# Patient Record
Sex: Male | Born: 1955 | Race: White | Hispanic: No | Smoking: Former smoker
Health system: Southern US, Community
[De-identification: ages and names within clinical notes are randomized; demographics above are authoritative.]

## PROBLEM LIST (undated history)

## (undated) DIAGNOSIS — E119 Type 2 diabetes mellitus without complications: Secondary | ICD-10-CM

## (undated) HISTORY — PX: CHOLECYSTECTOMY: SHX55

---

## 2013-05-10 ENCOUNTER — Ambulatory Visit: Payer: Self-pay | Admitting: Family Medicine

## 2014-06-16 ENCOUNTER — Ambulatory Visit: Payer: Self-pay | Admitting: Internal Medicine

## 2014-07-09 ENCOUNTER — Ambulatory Visit: Payer: Self-pay | Admitting: Hematology and Oncology

## 2014-07-09 LAB — CBC CANCER CENTER
Basophil #: 0 "x10 3/mm "
Basophil %: 0.9 %
Eosinophil #: 0.1 "x10 3/mm "
Eosinophil %: 2.4 %
HCT: 39.9 % — ABNORMAL LOW
HGB: 13.3 g/dL
Lymphocyte %: 37.8 %
Lymphs Abs: 1.4 "x10 3/mm "
MCH: 30.7 pg
MCHC: 33.2 g/dL
MCV: 92 fL
Monocyte #: 0.2 "x10 3/mm "
Monocyte %: 6.8 %
Neutrophil #: 1.9 "x10 3/mm "
Neutrophil %: 52.1 %
Platelet: 176 "x10 3/mm "
RBC: 4.32 "x10 6/mm " — ABNORMAL LOW
RDW: 13.3 %
WBC: 3.6 "x10 3/mm " — ABNORMAL LOW

## 2014-07-09 LAB — RETICULOCYTES
Absolute Retic Count: 0.0223 x10 6/uL
Reticulocyte: 0.52 %

## 2014-07-09 LAB — IRON AND TIBC
Iron Bind.Cap.(Total): 299 ug/dL (ref 250–450)
Iron Saturation: 32 %
Iron: 97 ug/dL (ref 65–175)
Unbound Iron-Bind.Cap.: 202 ug/dL

## 2014-07-09 LAB — FERRITIN: Ferritin (ARMC): 79 ng/mL (ref 8–388)

## 2014-07-09 LAB — TSH: Thyroid Stimulating Horm: 2.7 u[IU]/mL

## 2014-07-09 LAB — FOLATE: Folic Acid: 19.5 ng/mL — ABNORMAL HIGH

## 2014-08-05 ENCOUNTER — Ambulatory Visit
Admit: 2014-08-05 | Disposition: A | Discharge status: Home / Self Care | Payer: Self-pay | Attending: Hematology and Oncology | Admitting: Hematology and Oncology

## 2014-08-05 ENCOUNTER — Ambulatory Visit: Payer: Self-pay | Admitting: Gastroenterology

## 2014-09-25 ENCOUNTER — Ambulatory Visit: Admit: 2014-09-25 | Disposition: A | Discharge status: Home / Self Care | Payer: Self-pay | Admitting: Surgery

## 2014-09-30 ENCOUNTER — Ambulatory Visit
Admit: 2014-09-30 | Disposition: A | Discharge status: Home / Self Care | Payer: Self-pay | Attending: Surgery | Admitting: Surgery

## 2014-10-01 LAB — SURGICAL PATHOLOGY

## 2014-10-05 NOTE — Op Note (Signed)
PATIENT NAME:  Russell Lutz, Russell Lutz MR#:  [id number] DATE OF BIRTH:  22-Nov-1955  DATE OF PROCEDURE:  09/30/2014  PREOPERATIVE DIAGNOSIS: Chronic acalculous cholecystitis.   POSTOPERATIVE DIAGNOSIS:  Chronic acalculous cholecystitis.    PROCEDURE: Laparoscopic cholecystectomy, cholangiogram.   SURGEON: Renda RollsWilton Smith, MD   ANESTHESIA: General.   INDICATIONS: This 59 year old male has a history of epigastric pains and abnormal  gallbladder ejection fraction of 25% and surgery was recommended for definitive treatment.   DESCRIPTION OF PROCEDURE: The patient was placed on the operating table in the supine position under general anesthesia. The abdomen was prepared with clippers and ChloraPrep and draped in a sterile manner.   A short incision was made in the inferior aspect of the umbilicus and carried down to the deep fascia which was grasped with laryngeal hook and elevated. A Veress needle was inserted, aspirated, and irrigated with a saline solution. Next, the peritoneal cavity was inflated with carbon dioxide. The Veress needle was removed. The 10 mm cannula was inserted. The 10 mm, 0 degree laparoscope was inserted to view the peritoneal cavity. There was some carbon dioxide seen within the omentum.  The liver appeared normal. There was some minimal distention of the stomach and I had the anesthetist insert an orogastric tube to decompress the stomach. Another incision was made in the epigastrium, slightly to the right of the midline to introduce an 11 mm cannula. Two incisions were made in the lateral aspect of the right upper quadrant to introduce two 5 mm cannulas.   The gallbladder was retracted towards the right shoulder. There were a number of adhesions which were taken down with blunt and sharp dissection and hook and cautery. The infundibulum was retracted inferiorly and laterally. The porta hepatis was demonstrated. The cystic duct was dissected free from surrounding structures. The cystic  artery was dissected free from surrounding structures. The gallbladder neck was mobilized with incision of the visceral peritoneum. A critical view of safety was demonstrated. An Endoclip was placed across the cystic duct adjacent to the gallbladder neck. An incision was made in the cystic duct to introduce a Reddick catheter. Half strength Conray 60 dye was injected as the cholangiogram was done with fluoroscopy viewing  the biliary tree. There was some diffuse dilatation of the common bile duct and the common hepatic duct more normal appearing caliber of the biliary branches. There was prompt flow of dye into the duodenum and no retained calculi seen. The Reddick catheter was removed. The cystic duct was doubly ligated with Endoclips and divided. The cystic artery was controlled with double Endoclips and divided. The gallbladder was dissected free from the liver with hook and cautery. The gallbladder was delivered up through the infraumbilical incision,  was incised and suctioned to remove bile. The gallbladder was then removed and submitted in formalin for routine pathology. The right upper quadrant was further inspected. Hemostasis was intact. The cannulas were removed allowing carbon dioxide to escape from the peritoneal cavity. The wounds were inspected.  Several tiny bleeding points were cauterized. The skin was closed with interrupted 5-0 chromic subcuticular suture, benzoin, and Steri-Strips. Dressings were applied with paper tape. The patient tolerated surgery satisfactorily and was prepared for transfer to the recovery room.      ____________________________ Shela CommonsJ. Renda RollsWilton Smith, MD jws:tr D: 09/30/2014 12:41:40 ET T: 09/30/2014 13:58:55 ET JOB#: [id number]  cc: Adella HareJ. Wilton Smith, MD, <Dictator> Adella HareWILTON J SMITH MD ELECTRONICALLY SIGNED 10/01/2014 17:34

## 2014-10-07 ENCOUNTER — Other Ambulatory Visit: Payer: Self-pay | Admitting: Surgery

## 2014-10-07 DIAGNOSIS — R1011 Right upper quadrant pain: Secondary | ICD-10-CM

## 2014-10-08 ENCOUNTER — Ambulatory Visit
Admission: RE | Admit: 2014-10-08 | Discharge: 2014-10-08 | Disposition: A | Discharge status: Home / Self Care | Payer: BLUE CROSS/BLUE SHIELD | Source: Ambulatory Visit | Attending: Surgery | Admitting: Surgery

## 2014-10-08 ENCOUNTER — Ambulatory Visit: Payer: Self-pay

## 2014-10-08 ENCOUNTER — Other Ambulatory Visit: Payer: Self-pay | Admitting: Surgery

## 2014-10-08 DIAGNOSIS — R1011 Right upper quadrant pain: Secondary | ICD-10-CM

## 2014-10-08 DIAGNOSIS — Z9049 Acquired absence of other specified parts of digestive tract: Secondary | ICD-10-CM | POA: Diagnosis not present

## 2014-10-08 HISTORY — DX: Type 2 diabetes mellitus without complications: E11.9

## 2014-10-08 MED ORDER — IOHEXOL 300 MG/ML  SOLN
100.0000 mL | Freq: Once | INTRAMUSCULAR | Status: AC | PRN
  Administered 2014-10-08: 100 mL via INTRAVENOUS

## 2016-12-24 IMAGING — CT CT ABDOMEN W/ CM
3 of 5 series · 16 of 46 positions shown, 18 images · IV contrast (omnipaque)
Comparison: Intraoperative cholangiogram 09/30/2014. Ultrasound of
06/16/2014.

CLINICAL DATA: Cholecystectomy last week. Right upper quadrant pain
for 4 months. Nausea. Diabetes.

EXAM:
CT ABDOMEN WITH CONTRAST
TECHNIQUE: Multidetector CT imaging of the abdomen was performed using the
standard protocol following bolus administration of intravenous
contrast.
CONTRAST:  100mL OMNIPAQUE IOHEXOL 300 MG/ML  SOLN

[Series 2: routine abd pel with · axial · 0.71mm/px · z∈[-704,-518]mm · 11 of 45 slices shown, 13 images]
[im 4/45  soft-tissue]
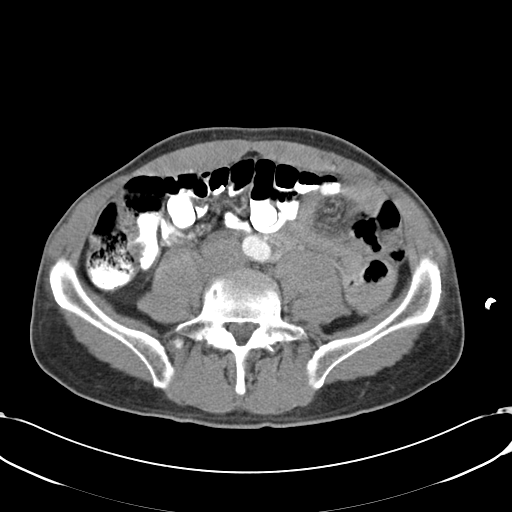
[im 4/45  bone]
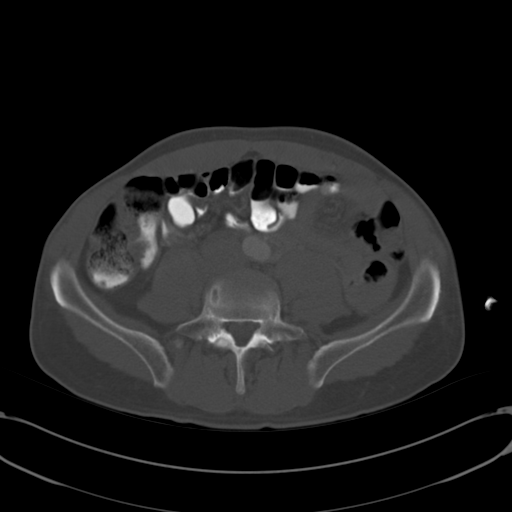
[im 8/45  soft-tissue]
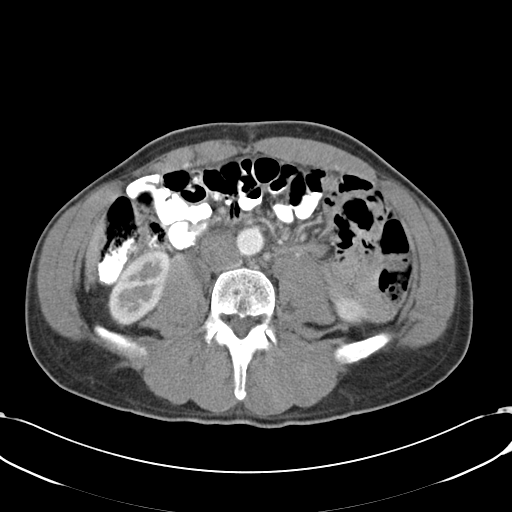
[im 12/45  soft-tissue]
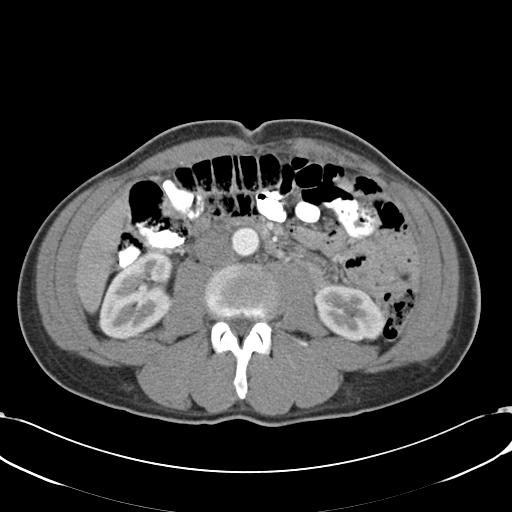
[im 15/45  soft-tissue]
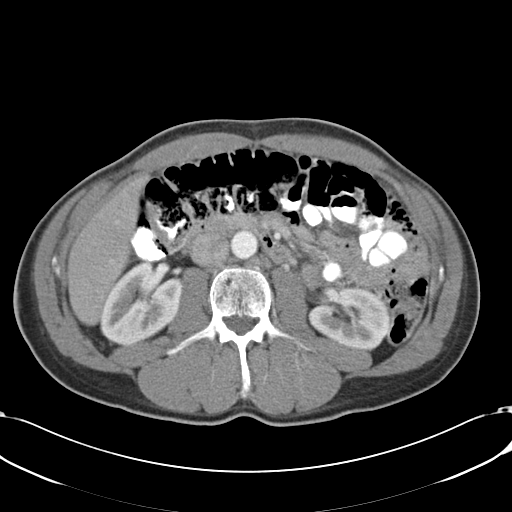
[im 19/45  soft-tissue]
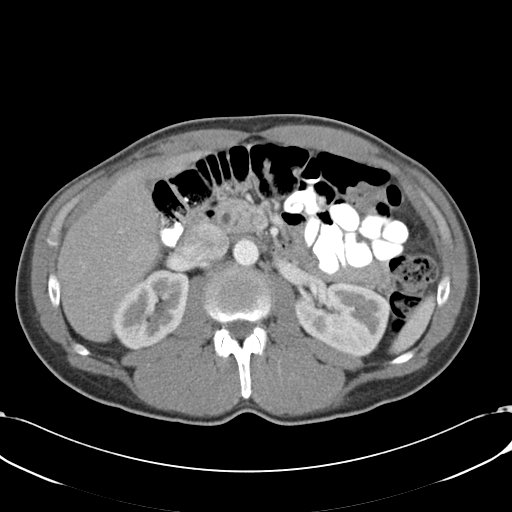
[im 23/45  soft-tissue]
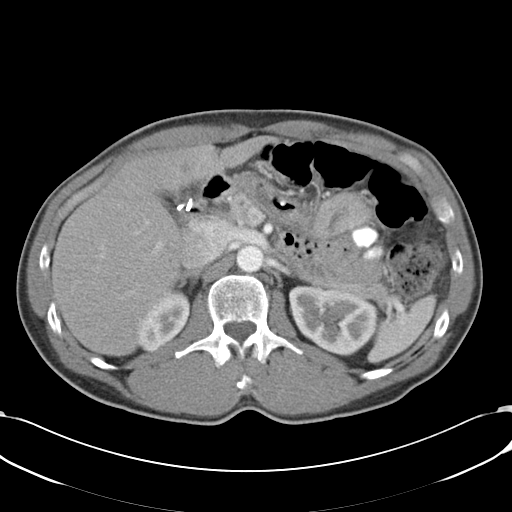
[im 26/45  soft-tissue]
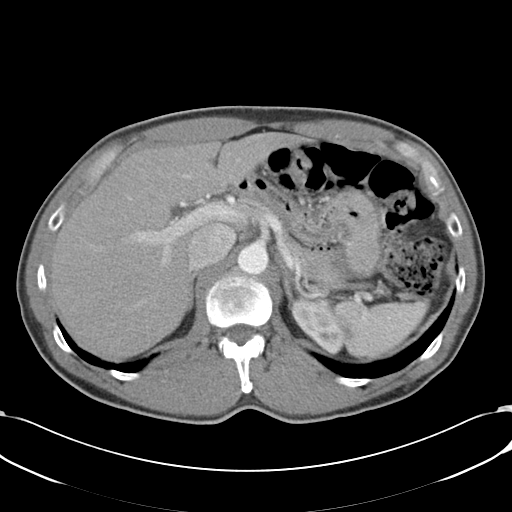
[im 30/45  soft-tissue]
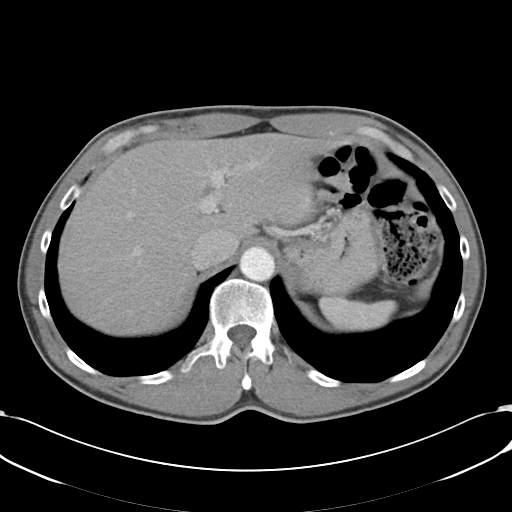
[im 34/45  soft-tissue]
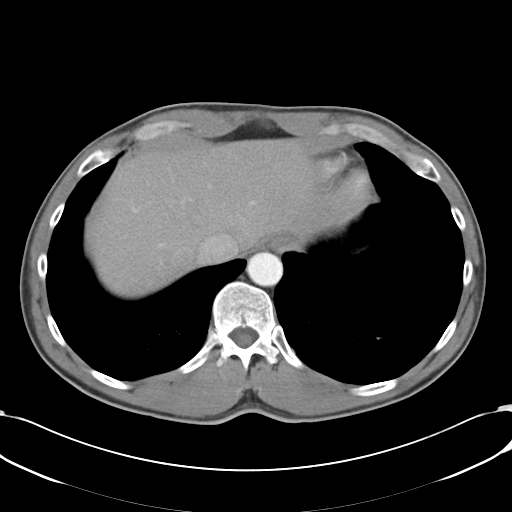
[im 34/45  bone]
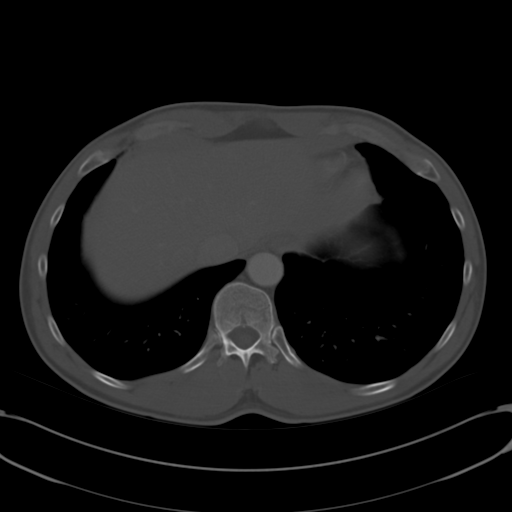
[im 37/45  soft-tissue]
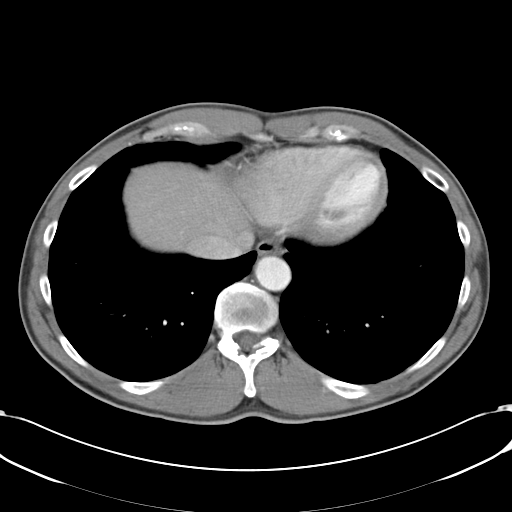
[im 41/45  soft-tissue]
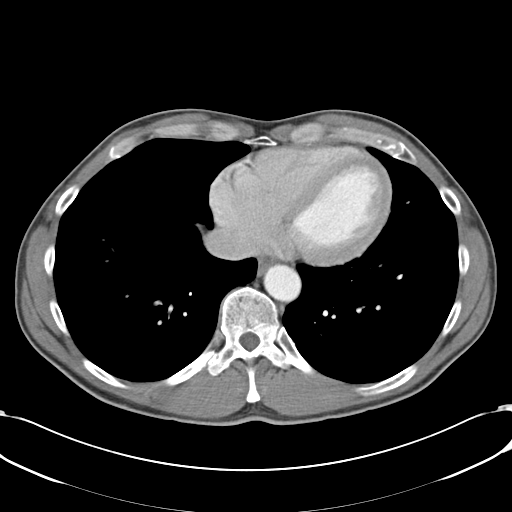

[Series 4: lung · axial · 0.71mm/px · z∈[-584,-564]mm · 2 of 22 slices shown]
[im 5/22  bone]
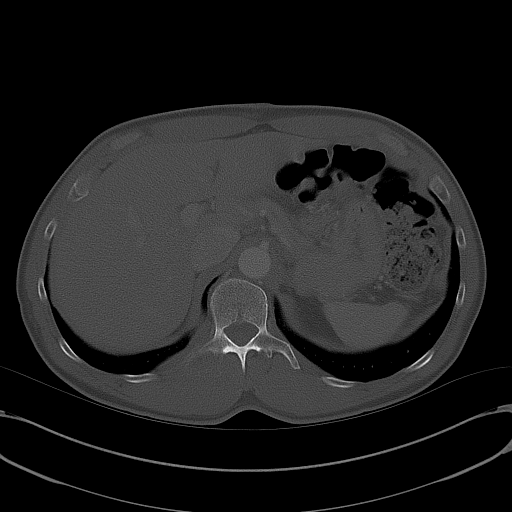
[im 9/22  bone]
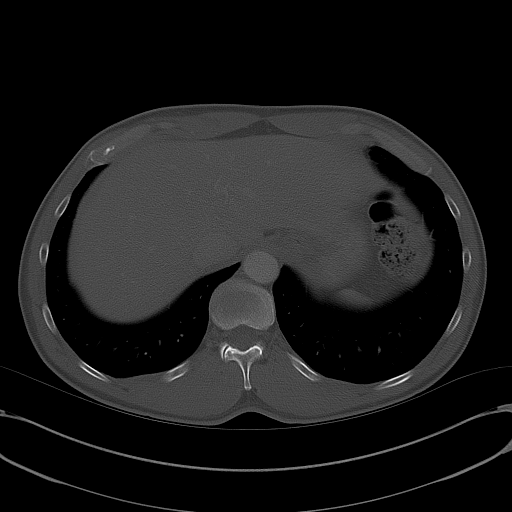

[Series 5: cor routine abd pel with · coronal · 0.49mm/px · 3 of 112 slices shown]
[im 38/112  soft-tissue]
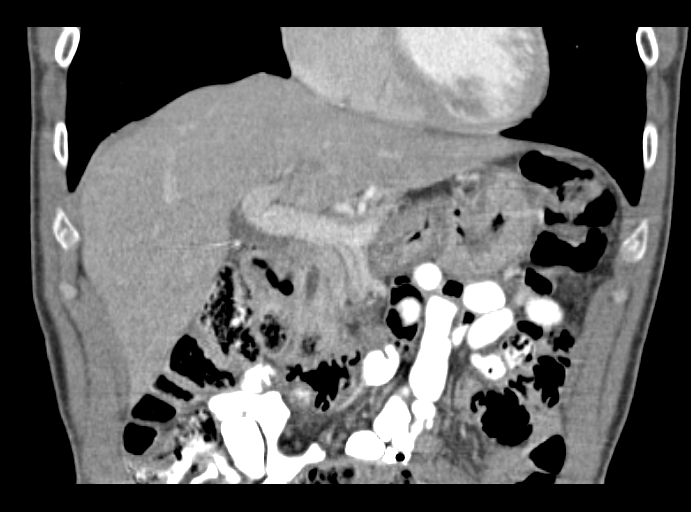
[im 50/112  soft-tissue]
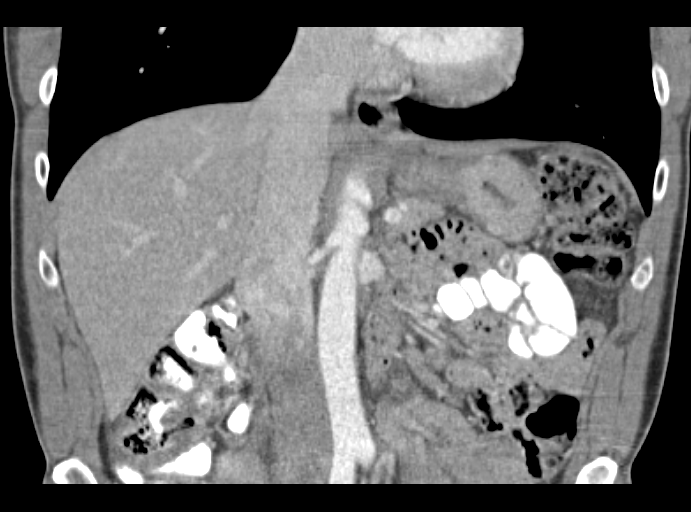
[im 62/112  soft-tissue]
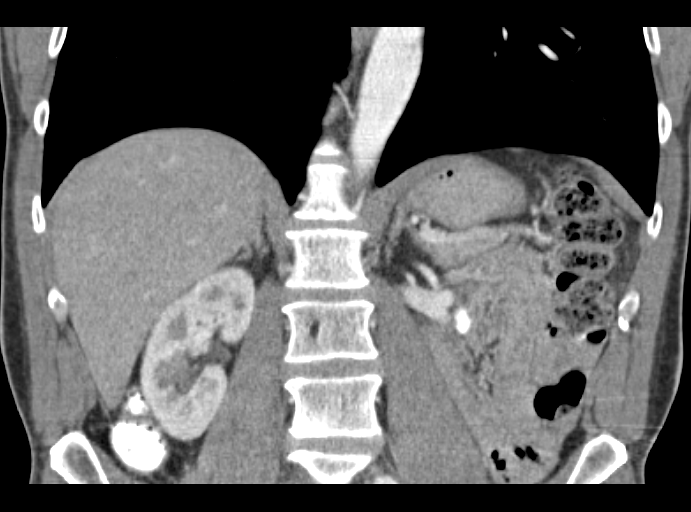

[16 of 46 positions shown; findings below may reference images not displayed]

FINDINGS: Lower chest: Clear lung bases. Normal heart size without pericardial
or pleural effusion.

Hepatobiliary: Normal liver. Cholecystectomy. No fluid collection in
the operative bed. Common duct measures 9 mm on coronal image 37. No
evidence of obstructive stone or mass.

Pancreas: Normal, without mass or ductal dilatation.

Spleen: Normal

Adrenals/Urinary Tract: Normal adrenal glands. Normal kidneys,
without hydronephrosis.

Stomach/Bowel: Collapsed proximal stomach. Normal caliber of
abdominal bowel loops. No pneumatosis or free intraperitoneal air.

Vascular/Lymphatic: Normal caliber of the aorta and branch vessels.
No retroperitoneal or retrocrural adenopathy.

Other: No ascites.

Musculoskeletal: Prominent disc bulge at L4-5.
IMPRESSION: 1. Status post cholecystectomy, without acute complication, biliary
duct dilatation, or evidence of choledocholithiasis.
2. No other explanation for right upper quadrant pain.
These results will be called to the ordering clinician or
representative by the [HOSPITAL] at the imaging location.

## 2017-07-24 ENCOUNTER — Other Ambulatory Visit: Payer: Self-pay

## 2017-07-24 ENCOUNTER — Encounter: Payer: Self-pay | Admitting: Emergency Medicine

## 2017-07-24 ENCOUNTER — Ambulatory Visit
Admission: EM | Admit: 2017-07-24 | Discharge: 2017-07-24 | Disposition: A | Discharge status: Home / Self Care | Payer: BLUE CROSS/BLUE SHIELD | Attending: Family Medicine | Admitting: Family Medicine

## 2017-07-24 DIAGNOSIS — W293XXA Contact with powered garden and outdoor hand tools and machinery, initial encounter: Secondary | ICD-10-CM

## 2017-07-24 DIAGNOSIS — S71111A Laceration without foreign body, right thigh, initial encounter: Secondary | ICD-10-CM | POA: Diagnosis not present

## 2017-07-24 DIAGNOSIS — Z23 Encounter for immunization: Secondary | ICD-10-CM

## 2017-07-24 MED ORDER — DOXYCYCLINE HYCLATE 100 MG PO TABS: 100.0000 mg | ORAL_TABLET | Freq: Two times a day (BID) | ORAL | 0 refills | Status: AC

## 2017-07-24 MED ORDER — TETANUS-DIPHTH-ACELL PERTUSSIS 5-2.5-18.5 LF-MCG/0.5 IM SUSP
0.5000 mL | Freq: Once | INTRAMUSCULAR | Status: AC
  Administered 2017-07-24: 0.5 mL via INTRAMUSCULAR

## 2017-07-24 NOTE — ED Provider Notes (Signed)
[facility]    CSN: [id number] Arrival date & time: 07/24/17  1214     History   Chief Complaint Chief Complaint  Patient presents with  . Extremity Laceration    right thigh    HPI Russell Lutz is a 62 y.o. male.   The history is provided by the patient.  Laceration  Location:  Leg Leg laceration location:  R upper leg Length:  4.5cm Depth:  Through dermis Quality: straight   Bleeding: venous   Time since incident:  2 hours Laceration mechanism:  Metal edge Foreign body present:  No foreign bodies Relieved by:  Pressure Tetanus status:  Unknown Associated symptoms: no numbness     Past Medical History:  Diagnosis Date  . Diabetes mellitus without complication (HCC)    type 1    There are no active problems to display for this patient.   Past Surgical History:  Procedure Laterality Date  . CHOLECYSTECTOMY         Home Medications    Prior to Admission medications   Medication Sig Start Date End Date Taking? Authorizing Provider  aspirin EC 81 MG tablet Take 81 mg by mouth daily.   Yes [provider]  insulin aspart (NOVOLOG FLEXPEN) 100 UNIT/ML FlexPen Inject into the skin 3 (three) times daily with meals. Sliding scale   Yes [provider]  Insulin Glargine (LANTUS SOLOSTAR) 100 UNIT/ML Solostar Pen Inject 10 Units into the skin at bedtime.   Yes [provider]  levothyroxine (SYNTHROID, LEVOTHROID) 88 MCG tablet Take 88 mcg by mouth daily before breakfast.   Yes [provider]  PARoxetine (PAXIL) 30 MG tablet Take 30 mg by mouth daily.   Yes [provider]  pravastatin (PRAVACHOL) 20 MG tablet Take 20 mg by mouth at bedtime.   Yes [provider]  doxycycline (VIBRA-TABS) 100 MG tablet Take 1 tablet (100 mg total) by mouth 2 (two) times daily. 07/24/17   [name], MD    Family History Family History  Problem Relation Age of Onset  . Healthy Mother   . Stroke Father    . Hypertension Father     Social History Social History   Tobacco Use  . Smoking status: Former Smoker    Last attempt to quit: 07/24/1972    Years since quitting: 45.0  . Smokeless tobacco: Never Used  . Tobacco comment: smoked for 1 year  Substance Use Topics  . Alcohol use: No    Frequency: Never  . Drug use: No     Allergies   Penicillins   Review of Systems Review of Systems   Physical Exam Triage Vital Signs ED Triage Vitals  Enc Vitals Group     BP 07/24/17 1230 118/76     Pulse Rate 07/24/17 1230 61     Resp 07/24/17 1230 20     Temp 07/24/17 1230 97.7 F (36.5 C)     Temp Source 07/24/17 1230 Oral     SpO2 07/24/17 1230 100 %     Weight 07/24/17 1231 150 lb (68 kg)     Height 07/24/17 1231 5\' 8"  (1.727 m)     Head Circumference --      Peak Flow --      Pain Score 07/24/17 1231 7     Pain Loc --      Pain Edu? --      Excl. in GC? --    No data found.  Updated Vital  Signs BP 118/76 (BP Location: Left Arm)   Pulse 61   Temp 97.7 F (36.5 C) (Oral)   Resp 20   Ht 5\' 8"  (1.727 m)   Wt 150 lb (68 kg)   SpO2 100%   BMI 22.81 kg/m   Visual Acuity Right Eye Distance:   Left Eye Distance:   Bilateral Distance:    Right Eye Near:   Left Eye Near:    Bilateral Near:     Physical Exam  Constitutional: He appears well-developed and well-nourished. No distress.  Musculoskeletal:       Right upper leg: He exhibits laceration (4.5cm anterior mid thigh). He exhibits no bony tenderness, no swelling, no edema and no deformity.  Skin: He is not diaphoretic.  Nursing note and vitals reviewed.    UC Treatments / Results  Labs (all labs ordered are listed, but only abnormal results are displayed) Labs Reviewed - No data to display  EKG  EKG Interpretation None       Radiology No results found.  Procedures Laceration Repair Date/Time: 07/24/2017 2:05 PM Performed by: Payton Mccallumonty, Destyn Parfitt, MD Authorized by: Payton Mccallumonty, Revan Gendron, MD   Consent:      Consent obtained:  Verbal   Consent given by:  Patient   Risks discussed:  Infection, pain, poor cosmetic result, need for additional repair, nerve damage, poor wound healing and retained foreign body   Alternatives discussed:  No treatment Anesthesia (see MAR for exact dosages):    Anesthesia method:  Topical application and local infiltration   Topical anesthetic:  LET   Local anesthetic:  Lidocaine 1% w/o epi Laceration details:    Location:  Leg   Leg location:  R upper leg   Length (cm):  4.5 Repair type:    Repair type:  Simple Pre-procedure details:    Preparation:  Patient was prepped and draped in usual sterile fashion Exploration:    Hemostasis achieved with:  Direct pressure and LET   Wound exploration: wound explored through full range of motion and entire depth of wound probed and visualized     Wound extent: areolar tissue violated     Wound extent: no fascia violation noted, no foreign bodies/material noted, no muscle damage noted, no nerve damage noted, no tendon damage noted, no underlying fracture noted and no vascular damage noted     Contaminated: no   Treatment:    Area cleansed with:  Betadine   Amount of cleaning:  Standard   Irrigation solution:  Sterile water   Irrigation method:  Pressure wash Skin repair:    Repair method:  Sutures and Steri-Strips   Suture size:  5-0   Suture technique:  Simple interrupted   Number of sutures:  7   Number of Steri-Strips:  2 Approximation:    Approximation:  Close Post-procedure details:    Dressing:  Antibiotic ointment and non-adherent dressing   Patient tolerance of procedure:  Tolerated well, no immediate complications   (including critical care time)  Medications Ordered in UC Medications  Tdap (BOOSTRIX) injection 0.5 mL (0.5 mLs Intramuscular Given 07/24/17 1242)     Initial Impression / Assessment and Plan / UC Course  I have reviewed the triage vital signs and the nursing notes.  Pertinent labs  & imaging results that were available during my care of the patient were reviewed by me and considered in my medical decision making (see chart for details).       Final Clinical Impressions(s) / UC Diagnoses  Final diagnoses:  Laceration of right thigh, initial encounter    ED Discharge Orders        Ordered    doxycycline (VIBRA-TABS) 100 MG tablet  2 times daily     07/24/17 1401     1. diagnosis reviewed with patient 2. rx as per orders above; reviewed possible side effects, interactions, risks and benefits  3. Procedure as per note above 4. Recommend supportive treatment with routine wound care 5. Follow-upin 8 days for suture removal or sooner prn   Controlled Substance Prescriptions [place] Controlled Substance Registry consulted? Not Applicable   [name], MD 07/24/17 1409

## 2017-07-24 NOTE — ED Triage Notes (Signed)
Patient in today after cutting himself with a chain saw. Patient's last tetanus >5 years ago.

## 2017-07-24 NOTE — Discharge Instructions (Signed)
Follow up in 8 days for suture removal.

## 2023-10-12 ENCOUNTER — Other Ambulatory Visit: Payer: Self-pay | Admitting: Internal Medicine

## 2023-10-12 ENCOUNTER — Ambulatory Visit
Admission: RE | Admit: 2023-10-12 | Discharge: 2023-10-12 | Disposition: A | Discharge status: Home / Self Care | Source: Ambulatory Visit | Attending: Internal Medicine | Admitting: Internal Medicine

## 2023-10-12 DIAGNOSIS — Z8719 Personal history of other diseases of the digestive system: Secondary | ICD-10-CM | POA: Diagnosis present

## 2023-10-12 DIAGNOSIS — R1032 Left lower quadrant pain: Secondary | ICD-10-CM

## 2023-10-12 DIAGNOSIS — R1031 Right lower quadrant pain: Secondary | ICD-10-CM

## 2023-10-12 LAB — POCT I-STAT CREATININE: Creatinine, Ser: 1 mg/dL (ref 0.61–1.24)

## 2023-10-12 MED ORDER — IOHEXOL 300 MG/ML  SOLN
100.0000 mL | Freq: Once | INTRAMUSCULAR | Status: AC | PRN
  Administered 2023-10-12: 100 mL via INTRAVENOUS
